# Patient Record
Sex: Male | Born: 1955 | Race: White | Hispanic: No | Smoking: Never smoker
Health system: Southern US, Community
[De-identification: ages and names within clinical notes are randomized; demographics above are authoritative.]

## PROBLEM LIST (undated history)

## (undated) DIAGNOSIS — I82409 Acute embolism and thrombosis of unspecified deep veins of unspecified lower extremity: Secondary | ICD-10-CM

## (undated) DIAGNOSIS — I2699 Other pulmonary embolism without acute cor pulmonale: Secondary | ICD-10-CM

## (undated) HISTORY — PX: ANKLE SURGERY: SHX546

## (undated) HISTORY — PX: FINGER SURGERY: SHX640

## (undated) HISTORY — PX: APPENDECTOMY: SHX54

---

## 2021-09-14 ENCOUNTER — Encounter (HOSPITAL_COMMUNITY): Payer: Self-pay

## 2021-09-14 ENCOUNTER — Other Ambulatory Visit: Payer: Self-pay

## 2021-09-14 ENCOUNTER — Observation Stay (HOSPITAL_COMMUNITY)
Admission: EM | Admit: 2021-09-14 | Discharge: 2021-09-16 | Disposition: A | Discharge status: Home / Self Care | Payer: Medicare Other | Attending: Internal Medicine | Admitting: Internal Medicine

## 2021-09-14 DIAGNOSIS — I700 Atherosclerosis of aorta: Secondary | ICD-10-CM | POA: Diagnosis not present

## 2021-09-14 DIAGNOSIS — M7989 Other specified soft tissue disorders: Secondary | ICD-10-CM | POA: Diagnosis not present

## 2021-09-14 DIAGNOSIS — Z86711 Personal history of pulmonary embolism: Secondary | ICD-10-CM | POA: Diagnosis not present

## 2021-09-14 DIAGNOSIS — N529 Male erectile dysfunction, unspecified: Secondary | ICD-10-CM | POA: Insufficient documentation

## 2021-09-14 DIAGNOSIS — Z86718 Personal history of other venous thrombosis and embolism: Secondary | ICD-10-CM | POA: Diagnosis not present

## 2021-09-14 DIAGNOSIS — Z7901 Long term (current) use of anticoagulants: Secondary | ICD-10-CM | POA: Insufficient documentation

## 2021-09-14 DIAGNOSIS — R0902 Hypoxemia: Secondary | ICD-10-CM | POA: Diagnosis not present

## 2021-09-14 DIAGNOSIS — R7989 Other specified abnormal findings of blood chemistry: Secondary | ICD-10-CM | POA: Insufficient documentation

## 2021-09-14 DIAGNOSIS — I2609 Other pulmonary embolism with acute cor pulmonale: Secondary | ICD-10-CM | POA: Diagnosis not present

## 2021-09-14 DIAGNOSIS — I77810 Thoracic aortic ectasia: Secondary | ICD-10-CM | POA: Diagnosis not present

## 2021-09-14 DIAGNOSIS — I2699 Other pulmonary embolism without acute cor pulmonale: Secondary | ICD-10-CM | POA: Diagnosis present

## 2021-09-14 DIAGNOSIS — R600 Localized edema: Secondary | ICD-10-CM | POA: Diagnosis not present

## 2021-09-14 DIAGNOSIS — I82402 Acute embolism and thrombosis of unspecified deep veins of left lower extremity: Secondary | ICD-10-CM

## 2021-09-14 DIAGNOSIS — M79605 Pain in left leg: Secondary | ICD-10-CM | POA: Diagnosis present

## 2021-09-14 DIAGNOSIS — I517 Cardiomegaly: Secondary | ICD-10-CM | POA: Insufficient documentation

## 2021-09-14 DIAGNOSIS — I824Y2 Acute embolism and thrombosis of unspecified deep veins of left proximal lower extremity: Secondary | ICD-10-CM | POA: Diagnosis not present

## 2021-09-14 DIAGNOSIS — R059 Cough, unspecified: Secondary | ICD-10-CM | POA: Insufficient documentation

## 2021-09-14 HISTORY — DX: Other pulmonary embolism without acute cor pulmonale: I26.99

## 2021-09-14 HISTORY — DX: Acute embolism and thrombosis of unspecified deep veins of unspecified lower extremity: I82.409

## 2021-09-14 LAB — CBC WITH DIFFERENTIAL/PLATELET
Abs Immature Granulocytes: 0.04 10*3/uL (ref 0.00–0.07)
Basophils Absolute: 0.1 10*3/uL (ref 0.0–0.1)
Basophils Relative: 1 %
Eosinophils Absolute: 0.1 10*3/uL (ref 0.0–0.5)
Eosinophils Relative: 1 %
HCT: 46.1 % (ref 39.0–52.0)
Hemoglobin: 15 g/dL (ref 13.0–17.0)
Immature Granulocytes: 1 %
Lymphocytes Relative: 44 %
Lymphs Abs: 3.8 10*3/uL (ref 0.7–4.0)
MCH: 29.5 pg (ref 26.0–34.0)
MCHC: 32.5 g/dL (ref 30.0–36.0)
MCV: 90.6 fL (ref 80.0–100.0)
Monocytes Absolute: 0.8 10*3/uL (ref 0.1–1.0)
Monocytes Relative: 9 %
Neutro Abs: 3.9 10*3/uL (ref 1.7–7.7)
Neutrophils Relative %: 44 %
Platelets: 258 10*3/uL (ref 150–400)
RBC: 5.09 MIL/uL (ref 4.22–5.81)
RDW: 13.9 % (ref 11.5–15.5)
WBC: 8.7 10*3/uL (ref 4.0–10.5)
nRBC: 0 % (ref 0.0–0.2)

## 2021-09-14 LAB — COMPREHENSIVE METABOLIC PANEL
ALT: 29 U/L (ref 0–44)
AST: 24 U/L (ref 15–41)
Albumin: 4.3 g/dL (ref 3.5–5.0)
Alkaline Phosphatase: 43 U/L (ref 38–126)
Anion gap: 10 (ref 5–15)
BUN: 20 mg/dL (ref 8–23)
CO2: 25 mmol/L (ref 22–32)
Calcium: 9.2 mg/dL (ref 8.9–10.3)
Chloride: 102 mmol/L (ref 98–111)
Creatinine, Ser: 1.14 mg/dL (ref 0.61–1.24)
GFR, Estimated: >60 mL/min (ref >60)
Glucose, Bld: 198 mg/dL — ABNORMAL HIGH (ref 70–99)
Potassium: 3.5 mmol/L (ref 3.5–5.1)
Sodium: 137 mmol/L (ref 135–145)
Total Bilirubin: 0.9 mg/dL (ref 0.3–1.2)
Total Protein: 7.6 g/dL (ref 6.5–8.1)

## 2021-09-14 LAB — D-DIMER, QUANTITATIVE: D-Dimer, Quant: 2.28 ug/mL-FEU — ABNORMAL HIGH (ref 0.00–0.50)

## 2021-09-14 NOTE — ED Provider Triage Note (Addendum)
Emergency Medicine Provider Triage Evaluation Note ? ?Lawson Radar , a 66 y.o. male  was evaluated in triage.  Pt complains of left leg swollen for the past 4 days. 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 Denies any chest pain or SOB. ? ?Review of Systems  ?Positive: Left leg swelling ?Negative: Chest pain or SOB ? ?Physical Exam  ?There were no vitals taken for this visit. ?Gen:   Awake, no distress   ?Resp:  Normal effort  ?MSK:   Moves extremities without difficulty  ?Other:  Swollen left leg. Palpable pulses.  ? ?Medical Decision Making  ?Medically screening exam initiated at 7:40 PM.  Appropriate orders placed.  Linden Tagliaferro was informed that the remainder of the evaluation will be completed by another provider, this initial triage assessment does not replace that evaluation, and the importance of remaining in the ED until their evaluation is complete. ? ?[place] not available. Will order D-dimer. Spoke with Dr. Posey Rea about this. ?  ?Achille Rich, PA-C ?09/14/21 1941 ? ?  ?Achille Rich, PA-C ?09/14/21 2003 ? ?

## 2021-09-15 ENCOUNTER — Encounter (HOSPITAL_COMMUNITY): Payer: Self-pay | Admitting: Internal Medicine

## 2021-09-15 ENCOUNTER — Observation Stay (HOSPITAL_BASED_OUTPATIENT_CLINIC_OR_DEPARTMENT_OTHER): Payer: Medicare Other

## 2021-09-15 ENCOUNTER — Emergency Department (HOSPITAL_COMMUNITY): Payer: Medicare Other

## 2021-09-15 DIAGNOSIS — I2699 Other pulmonary embolism without acute cor pulmonale: Secondary | ICD-10-CM

## 2021-09-15 DIAGNOSIS — M7989 Other specified soft tissue disorders: Secondary | ICD-10-CM | POA: Diagnosis not present

## 2021-09-15 DIAGNOSIS — I2609 Other pulmonary embolism with acute cor pulmonale: Secondary | ICD-10-CM

## 2021-09-15 DIAGNOSIS — R609 Edema, unspecified: Secondary | ICD-10-CM | POA: Diagnosis not present

## 2021-09-15 DIAGNOSIS — I824Y2 Acute embolism and thrombosis of unspecified deep veins of left proximal lower extremity: Secondary | ICD-10-CM | POA: Diagnosis not present

## 2021-09-15 LAB — ECHOCARDIOGRAM COMPLETE
Area-P 1/2: 3.72 cm2
Calc EF: 63.3 %
Height: 76 in
S' Lateral: 3.9 cm
Single Plane A2C EF: 62.7 %
Single Plane A4C EF: 63.4 %
Weight: 3712 oz

## 2021-09-15 LAB — TROPONIN I (HIGH SENSITIVITY)
Troponin I (High Sensitivity): 4 ng/L (ref <18)
Troponin I (High Sensitivity): 4 ng/L (ref <18)

## 2021-09-15 LAB — HIV ANTIBODY (ROUTINE TESTING W REFLEX): HIV Screen 4th Generation wRfx: NONREACTIVE

## 2021-09-15 LAB — BRAIN NATRIURETIC PEPTIDE: B Natriuretic Peptide: 8.6 pg/mL (ref 0.0–100.0)

## 2021-09-15 LAB — ANTITHROMBIN III: AntiThromb III Func: 98 % (ref 75–120)

## 2021-09-15 LAB — HEPARIN LEVEL (UNFRACTIONATED): Heparin Unfractionated: 0.29 IU/mL — ABNORMAL LOW (ref 0.30–0.70)

## 2021-09-15 MED ORDER — IOHEXOL 350 MG/ML SOLN
56.0000 mL | Freq: Once | INTRAVENOUS | Status: AC | PRN
Start: 2021-09-15 — End: 2021-09-15
  Administered 2021-09-15: 56 mL via INTRAVENOUS

## 2021-09-15 MED ORDER — ENOXAPARIN SODIUM 150 MG/ML IJ SOSY
150.0000 mg | PREFILLED_SYRINGE | INTRAMUSCULAR | Status: DC
  Administered 2021-09-15: 150 mg via SUBCUTANEOUS
  Filled 2021-09-15 (×2): qty 1

## 2021-09-15 MED ORDER — HEPARIN BOLUS VIA INFUSION
6000.0000 [IU] | Freq: Once | INTRAVENOUS | Status: AC
  Administered 2021-09-15: 6000 [IU] via INTRAVENOUS
  Filled 2021-09-15: qty 6000

## 2021-09-15 MED ORDER — POTASSIUM CHLORIDE CRYS ER 20 MEQ PO TBCR
40.0000 meq | EXTENDED_RELEASE_TABLET | Freq: Once | ORAL | Status: AC
  Administered 2021-09-15: 40 meq via ORAL
  Filled 2021-09-15: qty 2

## 2021-09-15 MED ORDER — ACETAMINOPHEN 650 MG RE SUPP
650.0000 mg | Freq: Four times a day (QID) | RECTAL | Status: DC | PRN
Start: 2021-09-15 — End: 2021-09-16

## 2021-09-15 MED ORDER — HEPARIN (PORCINE) 25000 UT/250ML-% IV SOLN
1900.0000 [IU]/h | INTRAVENOUS | Status: DC
  Administered 2021-09-15: 1700 [IU]/h via INTRAVENOUS
  Administered 2021-09-15: 1900 [IU]/h via INTRAVENOUS
  Filled 2021-09-15 (×2): qty 250

## 2021-09-15 MED ORDER — ACETAMINOPHEN 325 MG PO TABS: 650.0000 mg | ORAL_TABLET | Freq: Four times a day (QID) | ORAL | Status: DC | PRN

## 2021-09-15 NOTE — Progress Notes (Signed)
?  Echocardiogram ?2D Echocardiogram has been performed. ? ?Augustine Radar ?09/15/2021, 9:43 AM ?

## 2021-09-15 NOTE — ED Notes (Signed)
Patient transported to CT 

## 2021-09-15 NOTE — ED Notes (Signed)
Vascular at bedside

## 2021-09-15 NOTE — Progress Notes (Signed)
Patient admitted earlier this morning.  H&P reviewed.  Patient seen and examined. ? ?Patient denies any chest pain or shortness of breath currently.  Complains of pain/discomfort in the left lower extremity. ? ?Vital signs are stable. ? ?Lungs are clear to auscultation bilaterally. ?S1-S2 is normal regular. ?Abdomen is soft nontender nondistended ?Swelling of the left lower extremity is noted with mild erythema over the calf area. ?Alert and oriented x3.  No focal neurological deficits. ? ?CT angiogram report reviewed. ? ?Lower extremity Doppler studies pending. ? ?Echocardiogram is pending. ? ?Continue IV heparin for acute pulmonary embolism. He did take a road trip from [place] to [place] and then drove to [place] and back with his significant other.   ?This is his second episode of VTE.  His last episode of blood clot was about 13 years ago.   ?Hypercoagulable panel has been ordered.   ?Patient lives in [place] and has a primary care provider there.  He has been told that the results of hypercoagulable panel may take a few days to return and need to be followed up by his PCP.   ?Plan will be to transition to oral agents tomorrow depending on results of echocardiogram and stability. ? ?Osvaldo Shipper ?09/15/2021 ? ?

## 2021-09-15 NOTE — Assessment & Plan Note (Addendum)
Recurrent venous thromboembolism ?Patient with remote history of DVT/PE no longer on anticoagulation presenting with a chief complaint of left calf swelling. CTA chest showing acute pulmonary emboli involving bilateral lower lobe segmental and subsegmental pulmonary arteries.  RV to LV ratio is elevated at 1.2 suggesting possible right heart strain.  He is currently hemodynamically stable.  Not tachycardic or hypotensive.  Satting >92% on room air at rest, no respiratory distress.  BNP and troponin normal.  Long distance travel could be a possible precipitating factor as he drove from [place] to [place] a week ago, however, given recurrent VTE, thrombophilia work-up needs to be pursued. ?-Continue IV heparin ?-Echocardiogram ?-Lower extremity Dopplers ?-Hypercoagulable panel ?-Continuous pulse ox, supplemental oxygen as needed ?-Critical care consulted ?

## 2021-09-15 NOTE — Progress Notes (Signed)
Pt. Has blood clots and maybe discharged tomorrow. Visited with pt to continue support. This was a referral from on call night Chaplain. Will follow as needed. ? ?Fae Pippin, [facility] , Pager [phone]  ?

## 2021-09-15 NOTE — Progress Notes (Signed)
ANTICOAGULATION CONSULT NOTE - Initial Consult ? ?Pharmacy Consult for Heparin>>Lovenox ?Indication: pulmonary embolus ? ?No Known Allergies ? ?Patient Measurements: ?Height: 6\' 4"  (193 cm) ?Weight: 105.2 kg (232 lb) ?IBW/kg (Calculated) : 86.8 ?Heparin Dosing Weight: 105 kg ? ?Vital Signs: ?BP: 123/90 (04/04 1700) ?Pulse Rate: 70 (04/04 1700) ? ?Labs: ?Recent Labs  ?  09/14/21 ?1946 09/15/21 ?0350 09/15/21 ?[id number] 09/15/21 ?0945  ?HGB 15.0  --   --   --   ?HCT 46.1  --   --   --   ?PLT 258  --   --   --   ?HEPARINUNFRC  --   --   --  0.29*  ?CREATININE 1.14  --   --   --   ?TROPONINIHS  --  4 4  --   ? ? ? ?Estimated Creatinine Clearance: 83.8 mL/min (by C-G formula based on SCr of 1.14 mg/dL). ? ? ?Medical History: ?Past Medical History:  ?Diagnosis Date  ? DVT (deep venous thrombosis) ([place])   ? Pulmonary embolism ([place])   ? ? ?Medications:  ?Sildenafil prn only home med ? ?Assessment: ?66 y.o. M presents with L leg swelling and elevated d-dimer . Found to have b/l PE on chest CT with R heart strain. Noted previous h/o PT/DVT ~ 10 years ago. No AC PTA.  ? ?D/w Dr. [place] Pink, we will change heparin to Lovenox for PE. Plan to change to NOAC in AM.  ? ?Goal of Therapy:  ?Anti-Xa 1-2 ?Monitor platelets by anticoagulation protocol: Yes ?  ?Plan:  ?Dc heparin ?Lovenox 150mg  SQ qday ?F/u PO AC ? ?Onnie Boer, PharmD, BCIDP, AAHIVP, CPP ?Infectious Disease Pharmacist ?09/15/2021 5:39 PM ? ? ? ? ?

## 2021-09-15 NOTE — Progress Notes (Signed)
ANTICOAGULATION CONSULT NOTE - Initial Consult ? ?Pharmacy Consult for Heparin ?Indication: pulmonary embolus ? ?No Known Allergies ? ?Patient Measurements: ?Height: 6\' 4"  (193 cm) ?Weight: 105.2 kg (232 lb) ?IBW/kg (Calculated) : 86.8 ?Heparin Dosing Weight: 105 kg ? ?Vital Signs: ?BP: 130/95 (04/04 0830) ?Pulse Rate: 71 (04/04 0830) ? ?Labs: ?Recent Labs  ?  09/14/21 ?1946 09/15/21 ?0350 09/15/21 ?11/15/21 09/15/21 ?0945  ?HGB 15.0  --   --   --   ?HCT 46.1  --   --   --   ?PLT 258  --   --   --   ?HEPARINUNFRC  --   --   --  0.29*  ?CREATININE 1.14  --   --   --   ?TROPONINIHS  --  4 4  --   ? ? ? ?Estimated Creatinine Clearance: 83.8 mL/min (by C-G formula based on SCr of 1.14 mg/dL). ? ? ?Medical History: ?Past Medical History:  ?Diagnosis Date  ? DVT (deep venous thrombosis) ([facility])   ? Pulmonary embolism ([facility])   ? ? ?Medications:  ?Sildenafil prn only home med ? ?Assessment: ?66 y.o. M presents with L leg swelling and elevated d-dimer . Found to have b/l PE on chest CT with R heart strain. Noted previous h/o PT/DVT ~ 10 years ago. No AC PTA.  ? ?Heparin level 0.29 on 1700 units/hr ? ?Goal of Therapy:  ?Heparin level 0.3-0.7 units/ml ?Monitor platelets by anticoagulation protocol: Yes ?  ?Plan:  ?Increase Heparin gtt to 1900 units/hr to target higher end of range with PE ?Will f/u heparin level in 6 hours ?Daily heparin level and CBC ? ?Thank you for allowing pharmacy to be a part of this patient?s care. ? ?79, PharmD ?Clinical Pharmacist ? ?Please check AMION for all [facility] Pharmacy numbers ?After 10:00 PM, call Main Pharmacy [phone] ? ? ? ?

## 2021-09-15 NOTE — Progress Notes (Signed)
?  Transition of Care (TOC) Screening Note ? ? ?Patient Details  ?Name: Phillip Ingram ?Date of Birth: 1955/12/21 ? ? ?Transition of Care (TOC) CM/SW Contact:    ?Mearl Latin, LCSW ?Phone Number: ?09/15/2021, 4:12 PM ? ? ? ?Transition of Care Department [facility]) has reviewed patient and no TOC needs have been identified at this time. We will continue to monitor patient advancement through interdisciplinary progression rounds. If new patient transition needs arise, please place a TOC consult. ? ? ?

## 2021-09-15 NOTE — Consult Note (Signed)
? ?NAME:  Onofrio Klemp, MRN:  [id number], DOB:  21-Sep-1955, LOS: 0 ?ADMISSION DATE:  09/14/2021, CONSULTATION DATE:  09/15/2021 ?REFERRING MD: ED doc, CHIEF COMPLAINT: Calf swelling, leg pain ? ?History of Present Illness:  ?Patient came in with cough swelling, leg pain ?Started about 2 to 3 days ago ?Swelling on his left calf, some tenderness. ?He had had a DVT PE about 12 years ago that started about the same way.  Following the calf swelling in the previous episode started having shortness of breath which led to the diagnosis. ? ?Denies any shortness of breath ?Was able to put in a full day of work today ? ?No significant past medical history ?Non-smoker ?Was not on any long-term medications ?-Sildenafil as needed ? ?Pertinent  Medical History  ?History of PE ? ?Significant Hospital Events: ?Including procedures, antibiotic start and stop dates in addition to other pertinent events   ?CT with segmental/subsegmental clots.  Right-sided strain ? ?Interim History / Subjective:  ?Denies any chest pain or chest discomfort, denies any shortness of breath ? ?Objective   ?Blood pressure 112/75, pulse 66, temperature 98.2 ?F (36.8 ?C), temperature source Oral, resp. rate 17, height 6\' 4"  (1.93 m), weight 105.2 kg, SpO2 92 %. ?   ?   ?No intake or output data in the 24 hours ending 09/15/21 0417 ?Filed Weights  ? 09/15/21 0342  ?Weight: 105.2 kg  ? ? ?Examination: ?General: Middle-aged, does not appear to be in distress ?HENT: Moist oral mucosa ?Lungs: Clear breath sounds bilaterally ?Cardiovascular: S1-S2 appreciated ?Abdomen: Soft, bowel sounds appreciated ?Extremities: Left calf tenderness ?Neuro: Alert and oriented x3 ?GU:  ? ?CT scan of the chest was reviewed by myself ?Elevated D-dimer ? ?Resolved Hospital Problem list   ? ? ?Assessment & Plan:  ?DVT/PE ?Hemodynamically stable ?Respiratory status stable ?-Right heart strain ?-BNP/troponin pending ? ?Patient may be managed with anticoagulation ?Transition to DOAC when  stable ? ?.  Echocardiogram ? ?.  Ultrasound of the lower extremity ? ?11/15/21  Should be worked up for thrombophilia ? ?Recommend can be admitted to hospitalist service ? ? ?Best Practice (right click and "Reselect all SmartList Selections" daily)  ? ?Per primary ? ?Labs   ?CBC: ?Recent Labs  ?Lab 09/14/21 ?1946  ?WBC 8.7  ?NEUTROABS 3.9  ?HGB 15.0  ?HCT 46.1  ?MCV 90.6  ?PLT 258  ? ? ?Basic Metabolic Panel: ?Recent Labs  ?Lab 09/14/21 ?1946  ?NA 137  ?K 3.5  ?CL 102  ?CO2 25  ?GLUCOSE 198*  ?BUN 20  ?CREATININE 1.14  ?CALCIUM 9.2  ? ?GFR: ?Estimated Creatinine Clearance: 83.8 mL/min (by C-G formula based on SCr of 1.14 mg/dL). ?Recent Labs  ?Lab 09/14/21 ?1946  ?WBC 8.7  ? ? ?Liver Function Tests: ?Recent Labs  ?Lab 09/14/21 ?1946  ?AST 24  ?ALT 29  ?ALKPHOS 43  ?BILITOT 0.9  ?PROT 7.6  ?ALBUMIN 4.3  ? ?No results for input(s): LIPASE, AMYLASE in the last 168 hours. ?No results for input(s): AMMONIA in the last 168 hours. ? ?ABG ?No results found for: PHART, PCO2ART, PO2ART, HCO3, TCO2, ACIDBASEDEF, O2SAT  ? ?Coagulation Profile: ?No results for input(s): INR, PROTIME in the last 168 hours. ? ?Cardiac Enzymes: ?No results for input(s): CKTOTAL, CKMB, CKMBINDEX, TROPONINI in the last 168 hours. ? ?HbA1C: ?No results found for: HGBA1C ? ?CBG: ?No results for input(s): GLUCAP in the last 168 hours. ? ?Review of Systems:   ?No other positive findings apart from left calf tenderness ? ?Past Medical History:  ?  He,  has no past medical history on file.  ? ?Surgical History:  ?History reviewed. No pertinent surgical history.  ? ?Social History:  ? reports that he has never smoked. He has never used smokeless tobacco.  ? ?Family History:  ?His family history is not on file.  ? ?Allergies ?No Known Allergies  ? ?Home Medications  ?Prior to Admission medications   ?Medication Sig Start Date End Date Taking? Authorizing Provider  ?sildenafil (VIAGRA) 100 MG tablet Take 100 mg by mouth daily as needed for erectile dysfunction.  08/05/21  Yes [provider]  ?  ?Virl Diamond, MD ?[facility] ?Pager: See Amion ? ? ? ? ? ? ? ?

## 2021-09-15 NOTE — ED Provider Notes (Signed)
?[facility]-EMERGENCY DEPT ?[facility] Emergency Department ?Provider Note ?MRN:  [id number]  ?Arrival date & time: 09/15/21    ? ?Chief Complaint   ?Leg Pain ?  ?History of Present Illness   ?Phillip Ingram is a 66 y.o. year-old male presents to the ED with chief complaint of left calf swelling.  Patient reports history of PE and DVT.  He states this feels similar to prior DVT.  He is no longer anticoagulated, as his prior episodes were in the remote past.  He denies chest pain or shortness of breath.  Denies any trauma. ? ?History provided by patient. ? ? ?Review of Systems  ?Pertinent review of systems noted in HPI.  ? ? ?Physical Exam  ? ?Vitals:  ? 09/15/21 0300 09/15/21 0330  ?BP: 117/90 112/75  ?Pulse: 68 66  ?Resp:  17  ?Temp:    ?SpO2: 92% 92%  ?  ?CONSTITUTIONAL:  well-appearing, NAD ?NEURO:  Alert and oriented x 3, CN 3-12 grossly intact ?EYES:  eyes equal and reactive ?ENT/NECK:  Supple, no stridor  ?CARDIO: Normal rate, regular rhythm, appears well-perfused  ?PULM:  No respiratory distress, clear to auscultation bilaterally ?GI/GU:  non-distended,  ?MSK/SPINE:  No gross deformities, trace edema in bilateral lower extremities, left calf tenderness, but no redness ?SKIN:  no rash, atraumatic ? ? ?*Additional and/or pertinent findings included in MDM below ? ?Diagnostic and Interventional Summary  ? ? EKG Interpretation ? ?Date/Time:    ?Ventricular Rate:    ?PR Interval:    ?QRS Duration:   ?QT Interval:    ?QTC Calculation:   ?R Axis:     ?Text Interpretation:   ?  ? ?  ? ?Labs Reviewed  ?COMPREHENSIVE METABOLIC PANEL - Abnormal; Notable for the following components:  ?    Result Value  ? Glucose, Bld 198 (*)   ? All other components within normal limits  ?D-DIMER, QUANTITATIVE - Abnormal; Notable for the following components:  ? D-Dimer, Quant 2.28 (*)   ? All other components within normal limits  ?CBC WITH DIFFERENTIAL/PLATELET  ?BRAIN NATRIURETIC PEPTIDE  ?TROPONIN I (HIGH SENSITIVITY)  ?  ?CT Angio  Chest PE W and/or Wo Contrast  ?Final Result  ?  ?  ?Medications  ?iohexol (OMNIPAQUE) 350 MG/ML injection 56 mL (56 mLs Intravenous Contrast Given 09/15/21 0150)  ?  ? ?Procedures  /  Critical Care ?.Critical Care ?Performed by: Roxy Horseman, PA-C ?Authorized by: Roxy Horseman, PA-C  ? ?Critical care provider statement:  ?  Critical care time (minutes):  49 ?  Critical care was necessary to treat or prevent imminent or life-threatening deterioration of the following conditions:  Circulatory failure ?  Critical care was time spent personally by me on the following activities:  Development of treatment plan with patient or surrogate, discussions with consultants, evaluation of patient's response to treatment, examination of patient, ordering and review of laboratory studies, ordering and review of radiographic studies, ordering and performing treatments and interventions, pulse oximetry, re-evaluation of patient's condition and review of old charts ? ?ED Course and Medical Decision Making  ?I have reviewed the triage vital signs, the nursing notes, and pertinent available records from the EMR. ? ?Complexity of Problems Addressed: ?High Complexity: Acute illness/injury posing a threat to life or bodily function, requiring emergent diagnostic workup, evaluation, and treatment as below. ?Comorbidities affecting this illness/injury include: ?Prior PE ?Social Determinants Affecting Care: ?Complexity of care is increased due to smoking history. ? ? ?ED Course: ?After considering the following differential, PE,  DVT, muscle strain, I ordered CT PE. ?I personally interpreted the labs which are notable for elevated D-dimer. ?I visualized the CT, which is notable for bilateral segmental PEs and agree with radiologist interpretation.. ? ?Clinical Course as of 09/15/21 0344  ?Tue Sep 15, 2021  ?0342 Patient does have some episodes of mild hypoxia with O2 sat dropping to 90/91% on room air. [RB]  ?  ?Clinical Course User  Index ?[RB] Roxy Horseman, PA-C  ? ? ?Consultants: ?I discussed the case with Hospitalist, Dr. Loney Loh, who is appreciated for admitting. ?I consulted with Dr. Wynona Neat from Critical Care, who is appreciated for consulting.  Recommends hospitalist admission given that vitals are stable. ?Treatment and Plan: ? ?Patient's exam and diagnostic results are concerning for PE.  Feel that patient will need admission to the hospital for further treatment and evaluation. ? ?Patient seen by and discussed with attending physician, Dr. Blinda Leatherwood, who agrees with plan for admission. ? ?Final Clinical Impressions(s) / ED Diagnoses  ? ?  ICD-10-CM   ?1. Other acute pulmonary embolism with acute cor pulmonale (HCC)  I26.09   ?  ?  ?ED Discharge Orders   ? ? None  ? ?  ?  ? ? ?Discharge Instructions Discussed with and Provided to Patient:  ? ?Discharge Instructions   ?None ?  ? ?  ?Cathan, [place], PA-C ?09/15/21 0345 ? ?  ?Gilda Crease, MD ?09/15/21 [phone] ? ?

## 2021-09-15 NOTE — Progress Notes (Signed)
ANTICOAGULATION CONSULT NOTE - Initial Consult ? ?Pharmacy Consult for Heparin ?Indication: pulmonary embolus ? ?No Known Allergies ? ?Patient Measurements: ?Height: 6\' 4"  (193 cm) ?Weight: 105.2 kg (232 lb) ?IBW/kg (Calculated) : 86.8 ?Heparin Dosing Weight: 105 kg ? ?Vital Signs: ?Temp: 98.2 ?F (36.8 ?C) (04/03 2010) ?Temp Source: Oral (04/03 2010) ?BP: 112/75 (04/04 0330) ?Pulse Rate: 66 (04/04 0330) ? ?Labs: ?Recent Labs  ?  09/14/21 ?1946  ?HGB 15.0  ?HCT 46.1  ?PLT 258  ?CREATININE 1.14  ? ? ?Estimated Creatinine Clearance: 83.8 mL/min (by C-G formula based on SCr of 1.14 mg/dL). ? ? ?Medical History: ?History reviewed. No pertinent past medical history. ? ?Medications:  ?Sildenafil prn only home med ? ?Assessment: ?66 y.o. M presents with L leg swelling and elevated d-dimer . Found to have b/l PE on chest CT with R heart strain. Noted previous h/o PT/DVT ~ 10 years ago. No AC PTA.  ? ?Goal of Therapy:  ?Heparin level 0.3-0.7 units/ml ?Monitor platelets by anticoagulation protocol: Yes ?  ?Plan:  ?Heparin IV 6000 unit bolus ?Heparin gtt at 1700 units/hr ?Will f/u heparin level in 6 hours ?Daily heparin level and CBC ? ?Sherlon Handing, PharmD, BCPS ?Please see amion for complete clinical pharmacist phone list ?09/15/2021,3:49 AM ? ? ?

## 2021-09-15 NOTE — Progress Notes (Signed)
Lower extremity venous has been completed.  ? ?Preliminary results in CV Proc.  ? ?Phillip Ingram ?09/15/2021 9:00 AM    ?

## 2021-09-15 NOTE — H&P (Signed)
?History and Physical  ? ? ?Phillip Ingram EHU:[id number] DOB: Sep 27, 1955 DOA: 09/14/2021 ? ?PCP: Pcp, No ? ?Patient coming from: Home ? ?Chief Complaint: Leg swelling ? ?HPI: Phillip Ingram is a 66 y.o. 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  Noted to be mildly hypoxic with oxygen saturation dropping to 90-91% on room air.  Not tachycardic or hypotensive.  D-dimer elevated.  CTA chest showing acute pulmonary emboli involving bilateral lower lobe segmental and subsegmental pulmonary arteries.  RV to LV ratio is elevated at 1.2 suggesting possible right heart strain. Critical care evaluated the patient and felt that he was stable to be admitted to hospitalist service.  Patient was started on IV heparin. ? ?Patient reports 3 to 4-day history of left calf swelling.  Denies chest pain, shortness of breath, or lightheadedness/dizziness.  Reports history of left leg DVT and PE 12 years ago and was treated with Xarelto for a few months at that time.  Denies recent surgeries.  He is from [place] and drove to [place] a week ago.  Denies family history of blood clots.  No other complaints. ? ?Review of Systems:  ?Review of Systems  ?All other systems reviewed and are negative. ? ?Past Medical History:  ?Diagnosis Date  ? DVT (deep venous thrombosis) (HCC)   ? Pulmonary embolism (HCC)   ? ? ?Past Surgical History:  ?Procedure Laterality Date  ? ANKLE SURGERY Left   ? APPENDECTOMY    ? FINGER SURGERY Left   ? ? ? reports that he has never smoked. He has never used smokeless tobacco. He reports current alcohol use. He reports that he does not use drugs. ? ?No Known Allergies ? ?History reviewed. No pertinent family history. ? ?Prior to Admission medications   ?Medication Sig Start Date End Date Taking? Authorizing Provider  ?sildenafil (VIAGRA) 100 MG tablet Take 100 mg by mouth daily as needed for erectile  dysfunction. 08/05/21  Yes [provider]  ? ? ?Physical Exam: ?Vitals:  ? 09/15/21 0300 09/15/21 0330 09/15/21 0342 09/15/21 0430  ?BP: 117/90 112/75  117/83  ?Pulse: 68 66  65  ?Resp:  17  17  ?Temp:      ?TempSrc:      ?SpO2: 92% 92%  98%  ?Weight:   105.2 kg   ?Height:   6\' 4"  (1.93 m)   ? ? ?Physical Exam ?Vitals reviewed.  ?Constitutional:   ?   General: He is not in acute distress. ?HENT:  ?   Head: Normocephalic and atraumatic.  ?Eyes:  ?   Extraocular Movements: Extraocular movements intact.  ?   Conjunctiva/sclera: Conjunctivae normal.  ?Cardiovascular:  ?   Rate and Rhythm: Normal rate and regular rhythm.  ?   Pulses: Normal pulses.  ?Pulmonary:  ?   Effort: Pulmonary effort is normal. No respiratory distress.  ?   Breath sounds: Normal breath sounds. No wheezing or rales.  ?Abdominal:  ?   General: Bowel sounds are normal. There is no distension.  ?   Palpations: Abdomen is soft.  ?   Tenderness: There is no abdominal tenderness.  ?Musculoskeletal:  ?   Cervical back: Normal range of motion.  ?   Left lower leg: Edema present.  ?Skin: ?   General: Skin is warm and dry.  ?Neurological:  ?   General: No focal deficit present.  ?   Mental Status: He is alert and  oriented to person, place, and time.  ?  ? ?Labs on Admission: I have personally reviewed following labs and imaging studies ? ?CBC: ?Recent Labs  ?Lab 09/14/21 ?1946  ?WBC 8.7  ?NEUTROABS 3.9  ?HGB 15.0  ?HCT 46.1  ?MCV 90.6  ?PLT 258  ? ?Basic Metabolic Panel: ?Recent Labs  ?Lab 09/14/21 ?1946  ?NA 137  ?K 3.5  ?CL 102  ?CO2 25  ?GLUCOSE 198*  ?BUN 20  ?CREATININE 1.14  ?CALCIUM 9.2  ? ?GFR: ?Estimated Creatinine Clearance: 83.8 mL/min (by C-G formula based on SCr of 1.14 mg/dL). ?Liver Function Tests: ?Recent Labs  ?Lab 09/14/21 ?1946  ?AST 24  ?ALT 29  ?ALKPHOS 43  ?BILITOT 0.9  ?PROT 7.6  ?ALBUMIN 4.3  ? ?No results for input(s): LIPASE, AMYLASE in the last 168 hours. ?No results for input(s): AMMONIA in the last 168  hours. ?Coagulation Profile: ?No results for input(s): INR, PROTIME in the last 168 hours. ?Cardiac Enzymes: ?No results for input(s): CKTOTAL, CKMB, CKMBINDEX, TROPONINI in the last 168 hours. ?BNP (last 3 results) ?No results for input(s): PROBNP in the last 8760 hours. ?HbA1C: ?No results for input(s): HGBA1C in the last 72 hours. ?CBG: ?No results for input(s): GLUCAP in the last 168 hours. ?Lipid Profile: ?No results for input(s): CHOL, HDL, LDLCALC, TRIG, CHOLHDL, LDLDIRECT in the last 72 hours. ?Thyroid Function Tests: ?No results for input(s): TSH, T4TOTAL, FREET4, T3FREE, THYROIDAB in the last 72 hours. ?Anemia Panel: ?No results for input(s): VITAMINB12, FOLATE, FERRITIN, TIBC, IRON, RETICCTPCT in the last 72 hours. ?Urine analysis: ?No results found for: COLORURINE, APPEARANCEUR, LABSPEC, PHURINE, GLUCOSEU, HGBUR, BILIRUBINUR, KETONESUR, PROTEINUR, UROBILINOGEN, NITRITE, LEUKOCYTESUR ? ?Radiological Exams on Admission: I have personally reviewed images ?CT Angio Chest PE W and/or Wo Contrast ? ?Result Date: 09/15/2021 ?CLINICAL DATA:  Pulmonary embolus suspected. Positive D-dimer. Left leg swelling for 4 days. Previous history of DVT and pulmonary embolus 10 years ago. EXAM: CT ANGIOGRAPHY CHEST WITH CONTRAST TECHNIQUE: Multidetector CT imaging of the chest was performed using the standard protocol during bolus administration of intravenous contrast. Multiplanar CT image reconstructions and MIPs were obtained to evaluate the vascular anatomy. RADIATION DOSE REDUCTION: This exam was performed according to the departmental dose-optimization program which includes automated exposure control, adjustment of the mA and/or kV according to patient size and/or use of iterative reconstruction technique. CONTRAST:  17mL OMNIPAQUE IOHEXOL 350 MG/ML SOLN COMPARISON:  None. FINDINGS: Cardiovascular: Technically adequate study with good opacification of the central and segmental pulmonary arteries. Filling defects are  demonstrated in the right lower lobe segmental pulmonary artery extending into subsegmental branches in the right base. Anterior left lower lobe subsegmental filling defects. Right upper lung subsegmental filling defects. RV to LV ratio is increased at 1.3. No pericardial effusions. Normal caliber thoracic aorta. Coronary artery and aortic calcification. Mediastinum/Nodes: No enlarged mediastinal, hilar, or axillary lymph nodes. Thyroid gland, trachea, and esophagus demonstrate no significant findings. Lungs/Pleura: Lungs are clear. No pleural effusion or pneumothorax. Upper Abdomen: No acute abnormality. Musculoskeletal: No chest wall abnormality. No acute or significant osseous findings. Review of the MIP images confirms the above findings. IMPRESSION: 1. Positive examination for acute pulmonary emboli involving bilateral lower lobe segmental and subsegmental pulmonary arteries. RV to LV ratio is elevated at 1.2 suggesting possible right heart strain. 2. Lungs are clear. 3. Aortic atherosclerosis. Critical Value/emergent results were called by telephone at the time of interpretation on 09/15/2021 at 1:54 am to provider Roxy Horseman , who verbally acknowledged these results. Electronically Signed  By: Burman NievesWilliam  Stevens M.D.   On: 09/15/2021 02:04   ? ?Assessment and Plan: ?* Acute pulmonary embolism (HCC) ?Recurrent venous thromboembolism ?Patient with remote history of DVT/PE no longer on anticoagulation presenting with a chief complaint of left calf swelling. CTA chest showing acute pulmonary emboli involving bilateral lower lobe segmental and subsegmental pulmonary arteries.  RV to LV ratio is elevated at 1.2 suggesting possible right heart strain.  He is currently hemodynamically stable.  Not tachycardic or hypotensive.  Satting >92% on room air at rest, no respiratory distress.  BNP and troponin normal.  Long distance travel could be a possible precipitating factor [place], however, given recurrent VTE, thrombophilia work-up needs to be pursued. ?-Continue IV heparin ?-Echocardiogram ?-Lower extremity Dopplers ?-Hypercoagulable panel ?-Continuous pulse ox, supplemental oxygen as needed ?-

## 2021-09-16 ENCOUNTER — Other Ambulatory Visit (HOSPITAL_COMMUNITY): Payer: Self-pay

## 2021-09-16 DIAGNOSIS — I824Z2 Acute embolism and thrombosis of unspecified deep veins of left distal lower extremity: Secondary | ICD-10-CM

## 2021-09-16 DIAGNOSIS — I2699 Other pulmonary embolism without acute cor pulmonale: Secondary | ICD-10-CM | POA: Diagnosis not present

## 2021-09-16 DIAGNOSIS — I2609 Other pulmonary embolism with acute cor pulmonale: Secondary | ICD-10-CM | POA: Diagnosis not present

## 2021-09-16 DIAGNOSIS — I82402 Acute embolism and thrombosis of unspecified deep veins of left lower extremity: Secondary | ICD-10-CM

## 2021-09-16 LAB — CBC
HCT: 41.7 % (ref 39.0–52.0)
Hemoglobin: 14.1 g/dL (ref 13.0–17.0)
MCH: 30.5 pg (ref 26.0–34.0)
MCHC: 33.8 g/dL (ref 30.0–36.0)
MCV: 90.1 fL (ref 80.0–100.0)
Platelets: 229 10*3/uL (ref 150–400)
RBC: 4.63 MIL/uL (ref 4.22–5.81)
RDW: 14 % (ref 11.5–15.5)
WBC: 8 10*3/uL (ref 4.0–10.5)
nRBC: 0 % (ref 0.0–0.2)

## 2021-09-16 LAB — CARDIOLIPIN ANTIBODIES, IGG, IGM, IGA
Anticardiolipin IgA: <9 APL U/mL (ref 0–11)
Anticardiolipin IgG: <9 GPL U/mL (ref 0–14)
Anticardiolipin IgM: <9 MPL U/mL (ref 0–12)

## 2021-09-16 LAB — BASIC METABOLIC PANEL
Anion gap: 7 (ref 5–15)
BUN: 15 mg/dL (ref 8–23)
CO2: 23 mmol/L (ref 22–32)
Calcium: 8.5 mg/dL — ABNORMAL LOW (ref 8.9–10.3)
Chloride: 107 mmol/L (ref 98–111)
Creatinine, Ser: 1.04 mg/dL (ref 0.61–1.24)
GFR, Estimated: >60 mL/min (ref >60)
Glucose, Bld: 103 mg/dL — ABNORMAL HIGH (ref 70–99)
Potassium: 4.1 mmol/L (ref 3.5–5.1)
Sodium: 137 mmol/L (ref 135–145)

## 2021-09-16 LAB — HOMOCYSTEINE: Homocysteine: 11.4 umol/L (ref 0.0–17.2)

## 2021-09-16 MED ORDER — APIXABAN 5 MG PO TABS: 5.0000 mg | ORAL_TABLET | Freq: Two times a day (BID) | ORAL | Status: DC

## 2021-09-16 MED ORDER — APIXABAN 5 MG PO TABS
10.0000 mg | ORAL_TABLET | Freq: Two times a day (BID) | ORAL | Status: DC
  Administered 2021-09-16: 10 mg via ORAL
  Filled 2021-09-16: qty 2

## 2021-09-16 MED ORDER — APIXABAN (ELIQUIS) VTE STARTER PACK (10MG AND 5MG)
ORAL_TABLET | ORAL | 0 refills | Status: AC
  Filled 2021-09-16: qty 74, 30d supply, fill #0

## 2021-09-16 NOTE — Discharge Instructions (Addendum)
Follow with Primary MD  in 7 days  ? ?Get CBC, CMP,  checked  by Primary MD next visit.  ? ? ?Activity: As tolerated with Full fall precautions use walker/cane & assistance as needed ? ? ?Disposition Home  ? ? ?Diet: Heart Healthy  ? ? ?On your next visit with your primary care physician please Get Medicines reviewed and adjusted. ? ? ?Please request your Prim.MD to go over all Hospital Tests and Procedure/Radiological results at the follow up, please get all Hospital records sent to your Prim MD by signing hospital release before you go home. ? ? ?If you experience worsening of your admission symptoms, develop shortness of breath, life threatening emergency, suicidal or homicidal thoughts you must seek medical attention immediately by calling 911 or calling your MD immediately  if symptoms less severe. ? ?You Must read complete instructions/literature along with all the possible adverse reactions/side effects for all the Medicines you take and that have been prescribed to you. Take any new Medicines after you have completely understood and accpet all the possible adverse reactions/side effects.  ? ?Do not drive, operating heavy machinery, perform activities at heights, swimming or participation in water activities or provide baby sitting services if your were admitted for syncope or siezures until you have seen by Primary MD or a Neurologist and advised to do so again. ? ?Do not drive when taking Pain medications.  ? ? ?Do not take more than prescribed Pain, Sleep and Anxiety Medications ? ?Special Instructions: If you have smoked or chewed Tobacco  in the last 2 yrs please stop smoking, stop any regular Alcohol  and or any Recreational drug use. ? ?Wear Seat belts while driving. ? ? ?Please note ? ?You were cared for by a hospitalist during your hospital stay. If you have any questions about your discharge medications or the care you received while you were in the hospital after you are discharged, you can call  the unit and asked to speak with the hospitalist on call if the hospitalist that took care of you is not available. Once you are discharged, your primary care physician will handle any further medical issues. Please note that NO REFILLS for any discharge medications will be authorized once you are discharged, as it is imperative that you return to your primary care physician (or establish a relationship with a primary care physician if you do not have one) for your aftercare needs so that they can reassess your need for medications and monitor your lab values.  ? ?______________________________________________________________________________________________________ ?Information on my medicine - ELIQUIS? (apixaban) ? ?This medication education was reviewed with me or my healthcare representative as part of my discharge preparation.  ?Why was Eliquis? prescribed for you? ?Eliquis? was prescribed to treat blood clots that may have been found in the veins of your legs (deep vein thrombosis) or in your lungs (pulmonary embolism) and to reduce the risk of them occurring again. ? ?What do You need to know about Eliquis? ? ?The starting dose is 10 mg (two 5 mg tablets) taken TWICE daily for the FIRST SEVEN (7) DAYS, then on 09/23/2021 (Wednesday after discharge)  the dose is reduced to ONE 5 mg tablet taken TWICE daily.  Eliquis? may be taken with or without food.  ? ?Try to take the dose about the same time in the morning and in the evening. If you have difficulty swallowing the tablet whole please discuss with your pharmacist how to take the medication safely. ? ?Take Eliquis?  exactly as prescribed and DO NOT stop taking Eliquis? without talking to the doctor who prescribed the medication.  Stopping may increase your risk of developing a new blood clot.  Refill your prescription before you run out. ? ?After discharge, you should have regular check-up appointments with your healthcare provider that is prescribing your  Eliquis?. ?   ?What do you do if you miss a dose? ?If a dose of ELIQUIS? is not taken at the scheduled time, take it as soon as possible on the same day and twice-daily administration should be resumed. The dose should not be doubled to make up for a missed dose. ? ?Important Safety Information ?A possible side effect of Eliquis? is bleeding. You should call your healthcare provider right away if you experience any of the following: ?Bleeding from an injury or your nose that does not stop. ?Unusual colored urine (red or dark brown) or unusual colored stools (red or black). ?Unusual bruising for unknown reasons. ?A serious fall or if you hit your head (even if there is no bleeding). ? ?Some medicines may interact with Eliquis? and might increase your risk of bleeding or clotting while on Eliquis?Marland Kitchen To help avoid this, consult your healthcare provider or pharmacist prior to using any new prescription or non-prescription medications, including herbals, vitamins, non-steroidal anti-inflammatory drugs (NSAIDs) and supplements. ? ?This website has more information on Eliquis? (apixaban): http://www.eliquis.com/eliquis/home ? ?

## 2021-09-16 NOTE — Progress Notes (Signed)
ANTICOAGULATION CONSULT NOTE - Initial Consult ? ?Pharmacy Consult for Lovenox >> Apixaban ?Indication: pulmonary embolus ? ?No Known Allergies ? ?Patient Measurements: ?Height: 6\' 4"  (193 cm) ?Weight: 105.2 kg (232 lb) ?IBW/kg (Calculated) : 86.8 ? ?Vital Signs: ?Temp: 97.8 ?F (36.6 ?C) (04/05 0815) ?Temp Source: Oral (04/05 0815) ?BP: 113/84 (04/05 0815) ?Pulse Rate: 68 (04/05 0815) ? ?Labs: ?Recent Labs  ?  09/14/21 ?1946 09/15/21 ?0350 09/15/21 ?11/15/21 09/15/21 ?0945 09/16/21 ?0140  ?HGB 15.0  --   --   --  14.1  ?HCT 46.1  --   --   --  41.7  ?PLT 258  --   --   --  229  ?HEPARINUNFRC  --   --   --  0.29*  --   ?CREATININE 1.14  --   --   --  1.04  ?TROPONINIHS  --  4 4  --   --   ? ? ? ?Estimated Creatinine Clearance: 91.8 mL/min (by C-G formula based on SCr of 1.04 mg/dL). ? ? ?Medical History: ?Past Medical History:  ?Diagnosis Date  ? DVT (deep venous thrombosis) (HCC)   ? Pulmonary embolism (HCC)   ? ? ?Medications:  ?Sildenafil prn only home med ? ?Assessment: ?66 y.o. M presents with L leg swelling and elevated d-dimer . Found to have b/l PE on chest CT with R heart strain. Noted previous h/o PT/DVT ~ 10 years ago. No AC PTA.  ? ? ?Goal of Therapy:  ?Monitor platelets by anticoagulation protocol: Yes ?  ?Plan:  ?Dc Lovenox ?Start apixaban 10 mg po bid X7 days f/b 5 mg po bid thereafter ? ?[facility] will supply patient with a 30 day free discount card due to no active insurance found on record and [facility] stating the insurance they have on file was terminated in March 2023.  ? ? ?Gema Ringold BS, PharmD, BCPS ?Clinical Pharmacist ?09/16/2021 9:17 AM ? ? ? ? ?

## 2021-09-16 NOTE — Discharge Summary (Signed)
Physician Discharge Summary  ?Kiron Osmun DJM:[id number] DOB: 05-31-1956 DOA: 09/14/2021 ? ?PCP: Pcp, No ? ?Admit date: 09/14/2021 ?Discharge date: 09/16/2021 ? ?Admitted From: Home ?Disposition:  Home ? ?Recommendations for Outpatient Follow-up:  ?Follow up with PCP in 1-2 weeks ?Please obtain BMP/CBC in one week ?Please follow up on the final results of hypercoagulable panel has been sent. ? ?Home Health:NO ? ?Discharge Condition:Stable ?CODE STATUS:FULL ?Diet recommendation:Regular  ? ?Brief/Interim Summary: ? ? Callum Wolf is a 66 y.o. 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  Noted to be mildly hypoxic with oxygen saturation dropping to 90-91% on room air.  Not tachycardic or hypotensive.  D-dimer elevated.  CTA chest showing acute pulmonary emboli involving bilateral lower lobe segmental and subsegmental pulmonary arteries.  RV to LV ratio is elevated at 1.2 suggesting possible right heart strain. Critical care evaluated the patient and felt that he was stable to be admitted to hospitalist service.  Patient was started on IV heparin. ?  ?Patient reports 3 to 4-day history of left calf swelling.  Denies chest pain, shortness of breath, or lightheadedness/dizziness.  Reports history of left leg DVT and PE 12 years ago and was treated with Xarelto for a few months at that time.  Denies recent surgeries.  He is from [place] and drove to [place] a week ago.  Denies family history of blood clots.  No other complaints. ? ? ? ?Discharge Diagnoses:  ?Principal Problem: ?  Acute pulmonary embolism (HCC) ?Active Problems: ?  Acute deep vein thrombosis (DVT) of left lower extremity (HCC) ? ?Acute pulmonary embolism ?Left lower extremity acute DVT ?- Patient with remote history of DVT/PE no longer on anticoagulation presenting with a chief complaint of left calf swelling. CTA chest showing acute pulmonary emboli  involving bilateral lower lobe segmental and subsegmental pulmonary arteries.  RV to LV ratio is elevated at 1.2 suggesting possible right heart strain so he was admitted for further work-up. ?-The echo was obtained, no evidence of right heart strain, he has a preserved EF ?-Initial heparin GTT, and then later he was transitioned to full dose Lovenox, he is transition to Eliquis at time of discharge ?-Follow-up on final work-up of hypercoagulable work-up, still pending at time of discharge, discussed with the patient, he will follow with his PCP regarding final results, he did open MyChart account where he can review the results once available. ?-Venous Dopplers were obtained, no DVT in the right lower extremity, but finding consistent with age-indeterminate DVT in the left popliteal vein, and finding consistent with age-indeterminate superficial vein thrombosis in the left great saphenous vein, patient reports history of previous DVT in left lower extremity secondary to trauma, as this appears to be a provoked DVT given a road trip from [place] to [place] and then drove to [place] and back with his significant other. ?-Patient will need to continue at least for 6 months of full anticoagulation. ? ?Discharge Instructions ? ?Discharge Instructions   ? ? Discharge instructions   Complete by: As directed ?  ? Follow with Primary MD  in 7 days  ? ?Get CBC, CMP,  checked  by Primary MD next visit.  ? ? ?Activity: As tolerated with Full fall precautions use walker/cane & assistance as needed ? ? ?Disposition Home  ? ? ?Diet: Heart Healthy  ? ? ?On your next visit with your primary care physician please Get Medicines reviewed and adjusted. ? ? ?  Please request your Prim.MD to go over all Hospital Tests and Procedure/Radiological results at the follow up, please get all Hospital records sent to your Prim MD by signing hospital release before you go home. ? ? ?If you experience worsening of your admission symptoms,  develop shortness of breath, life threatening emergency, suicidal or homicidal thoughts you must seek medical attention immediately by calling 911 or calling your MD immediately  if symptoms less severe. ? ?You Must read complete instructions/literature along with all the possible adverse reactions/side effects for all the Medicines you take and that have been prescribed to you. Take any new Medicines after you have completely understood and accpet all the possible adverse reactions/side effects.  ? ?Do not drive, operating heavy machinery, perform activities at heights, swimming or participation in water activities or provide baby sitting services if your were admitted for syncope or siezures until you have seen by Primary MD or a Neurologist and advised to do so again. ? ?Do not drive when taking Pain medications.  ? ? ?Do not take more than prescribed Pain, Sleep and Anxiety Medications ? ?Special Instructions: If you have smoked or chewed Tobacco  in the last 2 yrs please stop smoking, stop any regular Alcohol  and or any Recreational drug use. ? ?Wear Seat belts while driving. ? ? ?Please note ? ?You were cared for by a hospitalist during your hospital stay. If you have any questions about your discharge medications or the care you received while you were in the hospital after you are discharged, you can call the unit and asked to speak with the hospitalist on call if the hospitalist that took care of you is not available. Once you are discharged, your primary care physician will handle any further medical issues. Please note that NO REFILLS for any discharge medications will be authorized once you are discharged, as it is imperative that you return to your primary care physician (or establish a relationship with a primary care physician if you do not have one) for your aftercare needs so that they can reassess your need for medications and monitor your lab values.  ? Increase activity slowly   Complete by: As  directed ?  ? ?  ? ?Allergies as of 09/16/2021   ?No Known Allergies ?  ? ?  ?Medication List  ?  ? ?TAKE these medications   ? ?Eliquis DVT/PE Starter Pack ?Generic drug: Apixaban Starter Pack (10mg  and 5mg ) ?Take as directed on package: start with two-5mg  tablets twice daily for 7 days. On day 8, switch to one-5mg  tablet twice daily. ?  ?sildenafil 100 MG tablet ?Commonly known as: VIAGRA ?Take 100 mg by mouth daily as needed for erectile dysfunction. ?  ? ?  ? ? ?No Known Allergies ? ?Consultations: ?PCCM ? ? ?Procedures/Studies: ?CT Angio Chest PE W and/or Wo Contrast ? ?Result Date: 09/15/2021 ?CLINICAL DATA:  Pulmonary embolus suspected. Positive D-dimer. Left leg swelling for 4 days. Previous history of DVT and pulmonary embolus 10 years ago. EXAM: CT ANGIOGRAPHY CHEST WITH CONTRAST TECHNIQUE: Multidetector CT imaging of the chest was performed using the standard protocol during bolus administration of intravenous contrast. Multiplanar CT image reconstructions and MIPs were obtained to evaluate the vascular anatomy. RADIATION DOSE REDUCTION: This exam was performed according to the departmental dose-optimization program which includes automated exposure control, adjustment of the mA and/or kV according to patient size and/or use of iterative reconstruction technique. CONTRAST:  40mL OMNIPAQUE IOHEXOL 350 MG/ML SOLN COMPARISON:  None.  FINDINGS: Cardiovascular: Technically adequate study with good opacification of the central and segmental pulmonary arteries. Filling defects are demonstrated in the right lower lobe segmental pulmonary artery extending into subsegmental branches in the right base. Anterior left lower lobe subsegmental filling defects. Right upper lung subsegmental filling defects. RV to LV ratio is increased at 1.3. No pericardial effusions. Normal caliber thoracic aorta. Coronary artery and aortic calcification. Mediastinum/Nodes: No enlarged mediastinal, hilar, or axillary lymph nodes. Thyroid  gland, trachea, and esophagus demonstrate no significant findings. Lungs/Pleura: Lungs are clear. No pleural effusion or pneumothorax. Upper Abdomen: No acute abnormality. Musculoskeletal: No chest wall

## 2021-09-16 NOTE — TOC Transition Note (Signed)
Transition of Care (TOC) - CM/SW Discharge Note ? ? ?Patient Details  ?Name: Phillip Ingram ?MRN: [id number] ?Date of Birth: 1955/11/24 ? ?Transition of Care (TOC) CM/SW Contact:  ?Harriet Masson, RN ?Phone Number: ?09/16/2021, 11:35 AM ? ? ?Clinical Narrative:    ?Patient stable for discharge.  ?New prescription for Eliquis. Prescription sent to [facility] pharmacy.  ? ? ? ?Final next level of care: Home/Self Care ?Barriers to Discharge: Barriers Resolved ? ? ?Patient Goals and CMS Choice ?Patient states their goals for this hospitalization and ongoing recovery are:: return home ?  ?  ? ?Discharge Placement ?  ?           ?  ? home ?  ?  ? ?Discharge Plan and Services ?  ?  ?           ? home ?  ?  ?  ?  ?  ?  ?  ?  ?  ? ?Social Determinants of Health (SDOH) Interventions ?  ? ? ?Readmission Risk Interventions ?   ? View : No data to display.  ?  ?  ?  ? ? ? ? ? ?

## 2021-09-16 NOTE — Care Management Obs Status (Signed)
MEDICARE OBSERVATION STATUS NOTIFICATION ? ? ?Patient Details  ?Name: Phillip Ingram ?MRN: [id number] ?Date of Birth: [birth year] ? ? ?Medicare Observation Status Notification Given:  Yes ? ? ? ?Harriet Masson, RN ?09/16/2021, 9:51 AM ?

## 2021-09-17 LAB — PROTEIN C, TOTAL: Protein C, Total: 111 % (ref 60–150)

## 2021-09-17 LAB — BETA-2-GLYCOPROTEIN I ABS, IGG/M/A
Beta-2 Glyco I IgG: <9 GPI IgG units (ref 0–20)
Beta-2-Glycoprotein I IgA: <9 GPI IgA units (ref 0–25)
Beta-2-Glycoprotein I IgM: <9 GPI IgM units (ref 0–32)

## 2021-09-18 LAB — PROTEIN S, TOTAL: Protein S Ag, Total: 95 % (ref 60–150)

## 2021-09-18 LAB — PROTEIN S ACTIVITY: Protein S Activity: 121 % (ref 63–140)

## 2021-09-18 LAB — PROTEIN C ACTIVITY: Protein C Activity: 158 % (ref 73–180)

## 2021-09-20 LAB — LUPUS ANTICOAGULANT PANEL
DRVVT: 50.5 s — ABNORMAL HIGH (ref 0.0–47.0)
PTT Lupus Anticoagulant: 44.3 s — ABNORMAL HIGH (ref 0.0–43.5)

## 2021-09-20 LAB — PTT-LA MIX: PTT-LA Mix: 40.6 s — ABNORMAL HIGH (ref 0.0–40.5)

## 2021-09-20 LAB — HEXAGONAL PHASE PHOSPHOLIPID: Hexagonal Phase Phospholipid: 9 s (ref 0–11)

## 2021-09-20 LAB — DRVVT MIX: dRVVT Mix: 43.5 s — ABNORMAL HIGH (ref 0.0–40.4)

## 2021-09-20 LAB — DRVVT CONFIRM: dRVVT Confirm: 1.1 ratio (ref 0.8–1.2)

## 2021-09-21 LAB — FACTOR 5 LEIDEN

## 2021-09-21 LAB — PROTHROMBIN GENE MUTATION

## 2021-09-22 ENCOUNTER — Other Ambulatory Visit (HOSPITAL_COMMUNITY): Payer: Self-pay

## 2021-09-22 ENCOUNTER — Telehealth (HOSPITAL_COMMUNITY): Payer: Self-pay

## 2021-09-22 NOTE — Telephone Encounter (Signed)
Pharmacy Transitions of Care Follow-up Telephone Call ? ?Date of discharge: 09/16/21  ?Discharge Diagnosis: Acute PE ? ?How have you been since you were released from the hospital? Patient has been well since discharge, no questions about meds at this time.  ? ?Medication changes made at discharge: ? - START: Eliquis Starter Pack ? ?Medication changes verified by the patient? Yes ?  ? ?Medication Accessibility: ? ?Home Pharmacy: Not discussed  ? ?Was the patient provided with refills on discharged medications? No  ? ?Have all prescriptions been transferred from [facility] to home pharmacy? N/A  ? ?Is the patient able to afford medications? Has insurance ?  ? ?Medication Review: ? ?APIXABAN (ELIQUIS)  ?Apixaban 10 mg BID initiated on 09/16/21. Will switch to apixaban 5 mg BID after 7 days (DATE 09/22/21).  ?- Discussed importance of taking medication around the same time everyday  ?- Advised patient of medications to avoid (NSAIDs, ASA)  ?- Educated that Tylenol (acetaminophen) will be the preferred analgesic to prevent risk of bleeding  ?- Emphasized importance of monitoring for signs and symptoms of bleeding (abnormal bruising, prolonged bleeding, nose bleeds, bleeding from gums, discolored urine, black tarry stools)  ?- Advised patient to alert all providers of anticoagulation therapy prior to starting a new medication or having a procedure  ? ? ?Follow-up Appointments: ? ?PCP Hospital f/u appt confirmed? Patient is getting set up to see specialist and [place]. ? ?If their condition worsens, is the pt aware to call PCP or go to the Emergency Dept.? Yes ? ?Final Patient Assessment: ?Patient has f/u ad knows to get refills at f/u ? ?

## 2021-09-22 NOTE — Telephone Encounter (Signed)
Transitions of Care Pharmacy  ° °Call attempted for a pharmacy transitions of care follow-up. HIPAA appropriate voicemail was left with call back information provided.  ° °Call attempt #1. Will follow-up in 2-3 days.  °  °

## 2023-04-06 IMAGING — CT CT ANGIO CHEST
2 of 6 series · 18 of 36 positions shown · IV contrast (agent unspecified)
Comparison: None.

CLINICAL DATA: Pulmonary embolus suspected. Positive D-dimer. Left
leg swelling for 4 days. Previous history of DVT and pulmonary
embolus 10 years ago.

EXAM:
CT ANGIOGRAPHY CHEST WITH CONTRAST
TECHNIQUE: Multidetector CT imaging of the chest was performed using the
standard protocol during bolus administration of intravenous
contrast. Multiplanar CT image reconstructions and MIPs were
obtained to evaluate the vascular anatomy.

[Series 7: pe thins · axial · 0.90mm/px · z∈[+1213,+1453]mm · 17 of 380 slices shown]
[im 19/380  lung]
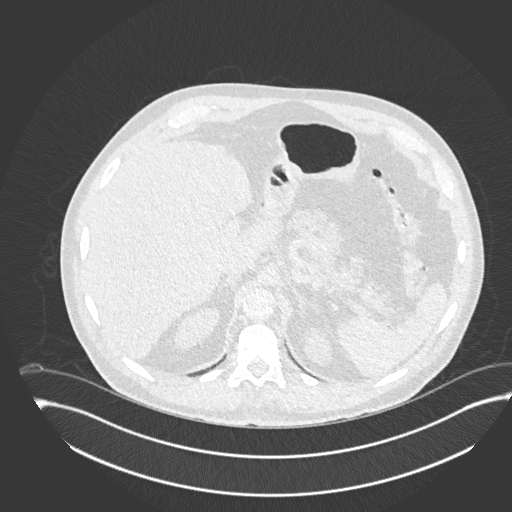
[im 38/380  mediastinal]
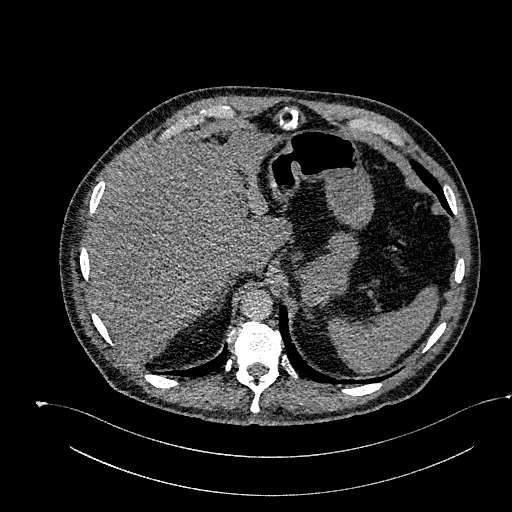
[im 57/380  lung]
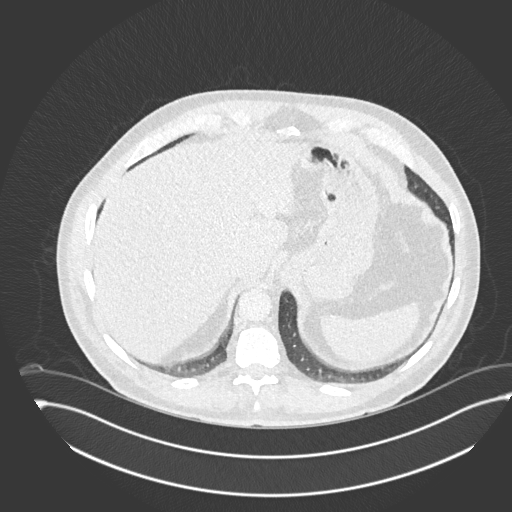
[im 76/380  mediastinal]
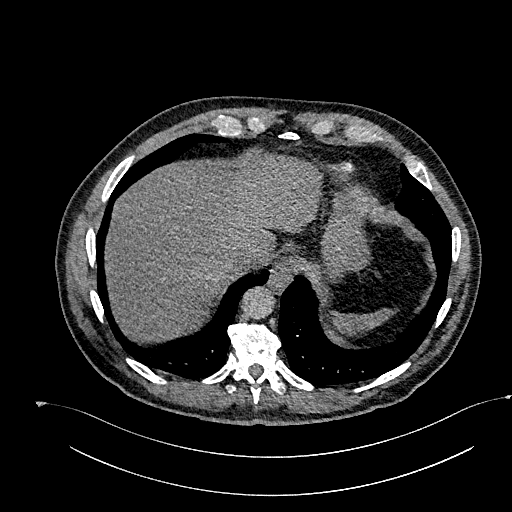
[im 114/380  lung]
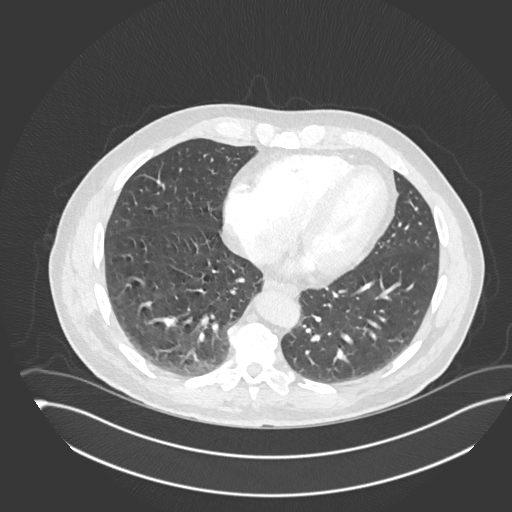
[im 133/380  mediastinal]
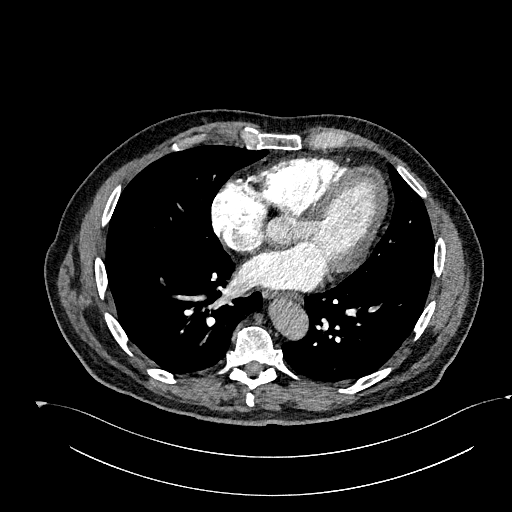
[im 152/380  lung]
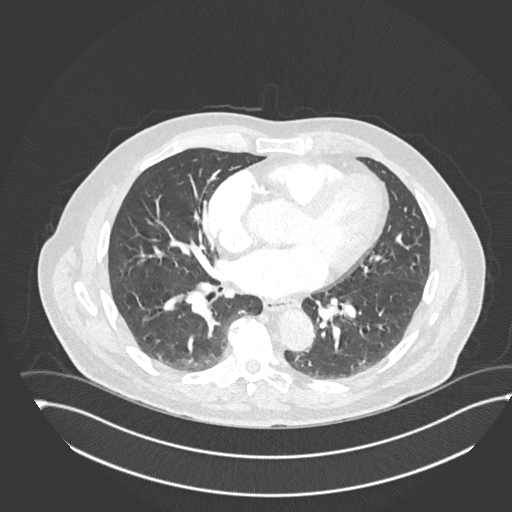
[im 171/380  mediastinal]
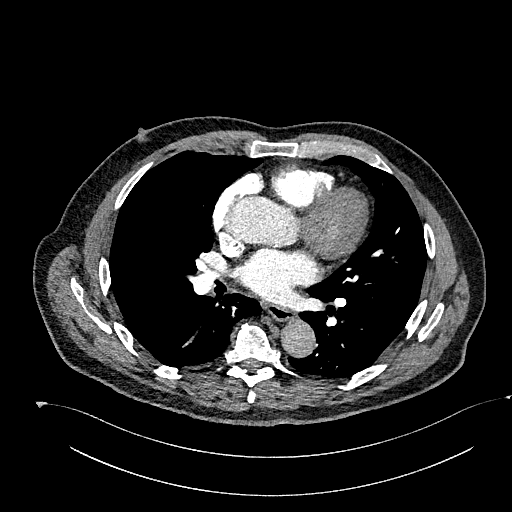
[im 190/380  lung]
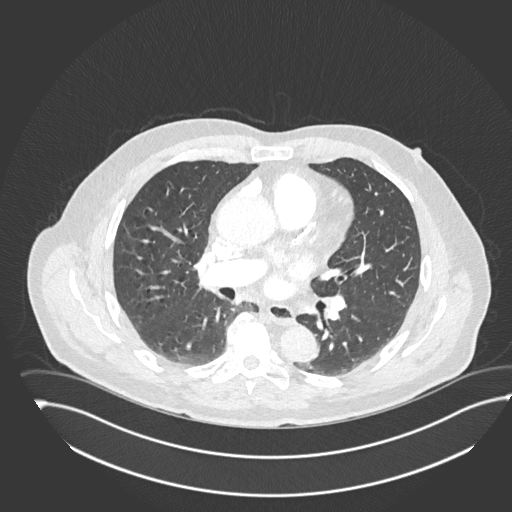
[im 209/380  mediastinal]
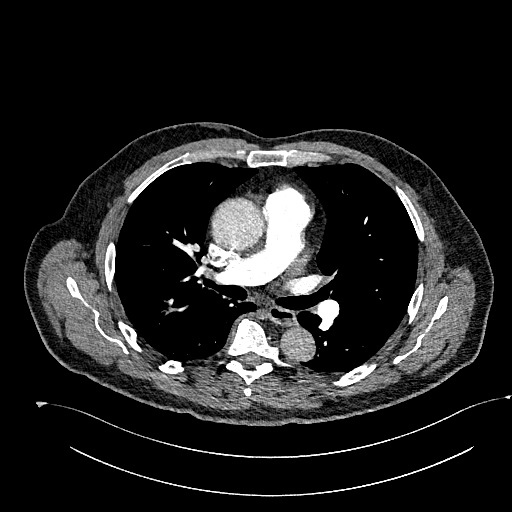
[im 228/380  lung]
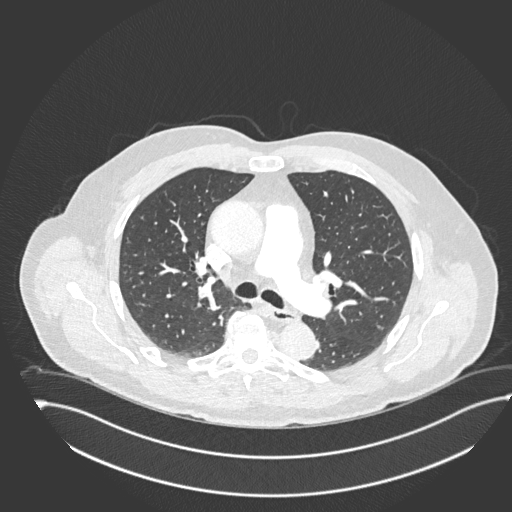
[im 247/380  mediastinal]
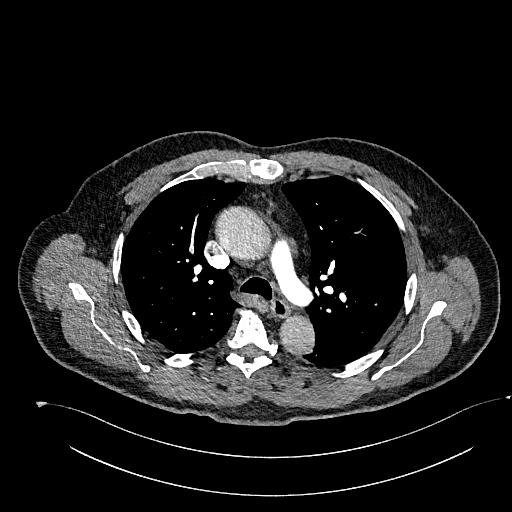
[im 266/380  lung]
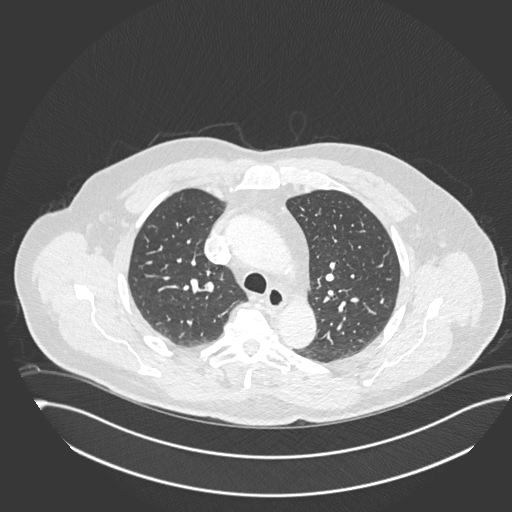
[im 304/380  mediastinal]
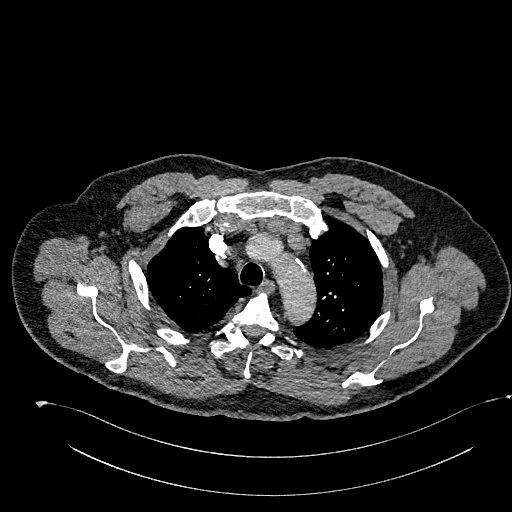
[im 323/380  lung]
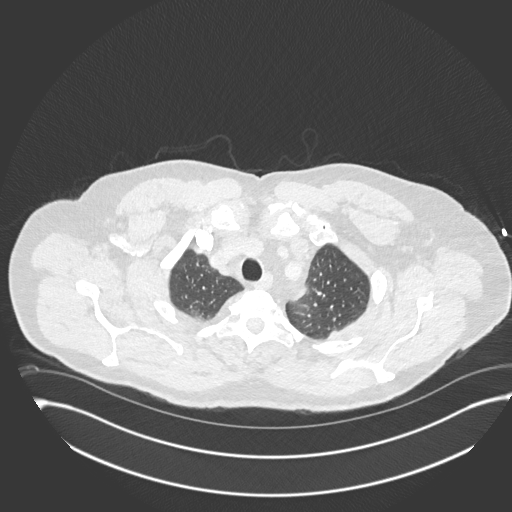
[im 342/380  mediastinal]
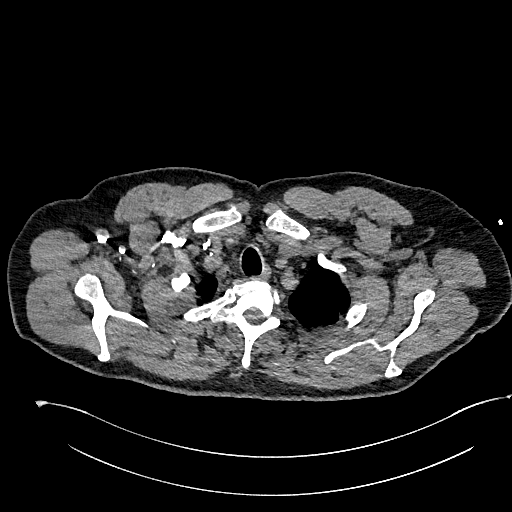
[im 361/380  lung]
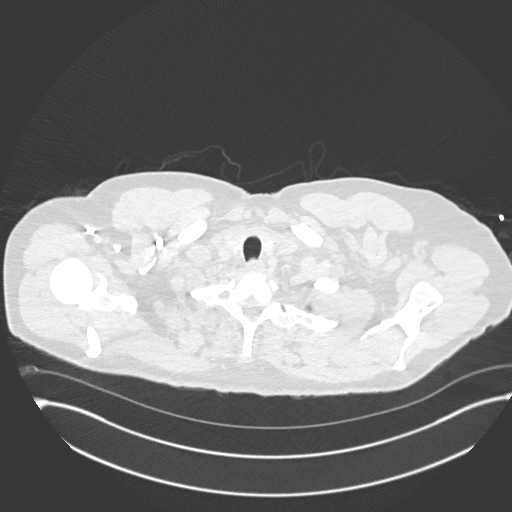

[Series 8: pe 2mm cor · coronal · 0.54mm/px · 1 of 151 slices shown]
[im 76/151  mediastinal]
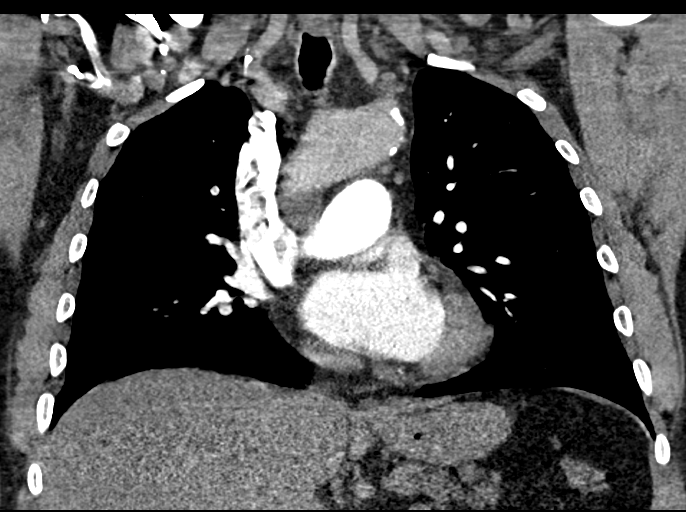

[18 of 36 positions shown; findings below may reference images not displayed]

RADIATION DOSE REDUCTION: This exam was performed according to the
departmental dose-optimization program which includes automated
exposure control, adjustment of the mA and/or kV according to
patient size and/or use of iterative reconstruction technique.

CONTRAST:  56mL OMNIPAQUE IOHEXOL 350 MG/ML SOLN
FINDINGS: Cardiovascular: Technically adequate study with good opacification
of the central and segmental pulmonary arteries. Filling defects are
demonstrated in the right lower lobe segmental pulmonary artery
extending into subsegmental branches in the right base. Anterior
left lower lobe subsegmental filling defects. Right upper lung
subsegmental filling defects. RV to LV ratio is increased at 1.3. No
pericardial effusions. Normal caliber thoracic aorta. Coronary
artery and aortic calcification.

Mediastinum/Nodes: No enlarged mediastinal, hilar, or axillary lymph
nodes. Thyroid gland, trachea, and esophagus demonstrate no
significant findings.

Lungs/Pleura: Lungs are clear. No pleural effusion or pneumothorax.

Upper Abdomen: No acute abnormality.

Musculoskeletal: No chest wall abnormality. No acute or significant
osseous findings.

Review of the MIP images confirms the above findings.
IMPRESSION: 1. Positive examination for acute pulmonary emboli involving
bilateral lower lobe segmental and subsegmental pulmonary arteries.
RV to LV ratio is elevated at 1.2 suggesting possible right heart
strain.
2. Lungs are clear.
3. Aortic atherosclerosis.

Critical Value/emergent results were called by telephone at the time
of interpretation on 09/15/2021 at [DATE] to provider [place]
, who verbally acknowledged these results.
